# Patient Record
Sex: Female | Born: 1945 | Race: White | Hispanic: No | Marital: Married | Smoking: Former smoker
Health system: Southern US, Community
[De-identification: ages and names within clinical notes are randomized; demographics above are authoritative.]

## PROBLEM LIST (undated history)

## (undated) DIAGNOSIS — I10 Essential (primary) hypertension: Secondary | ICD-10-CM

## (undated) DIAGNOSIS — I8290 Acute embolism and thrombosis of unspecified vein: Secondary | ICD-10-CM

## (undated) DIAGNOSIS — M1990 Unspecified osteoarthritis, unspecified site: Secondary | ICD-10-CM

## (undated) HISTORY — PX: COLON SURGERY: SHX602

---

## 2009-08-25 ENCOUNTER — Ambulatory Visit: Payer: Self-pay | Admitting: Internal Medicine

## 2010-09-01 ENCOUNTER — Ambulatory Visit: Payer: Self-pay | Admitting: Internal Medicine

## 2011-09-14 ENCOUNTER — Ambulatory Visit: Payer: Self-pay | Admitting: Internal Medicine

## 2013-01-29 ENCOUNTER — Ambulatory Visit: Payer: Self-pay | Admitting: Internal Medicine

## 2014-05-22 ENCOUNTER — Ambulatory Visit: Payer: Self-pay | Admitting: Internal Medicine

## 2015-06-11 ENCOUNTER — Other Ambulatory Visit: Payer: Self-pay | Admitting: Internal Medicine

## 2015-06-11 DIAGNOSIS — Z1231 Encounter for screening mammogram for malignant neoplasm of breast: Secondary | ICD-10-CM

## 2015-06-19 ENCOUNTER — Ambulatory Visit
Admission: RE | Admit: 2015-06-19 | Discharge: 2015-06-19 | Disposition: A | Discharge status: Home / Self Care | Payer: Commercial Managed Care - HMO | Source: Ambulatory Visit | Attending: Internal Medicine | Admitting: Internal Medicine

## 2015-06-19 ENCOUNTER — Other Ambulatory Visit: Payer: Self-pay | Admitting: Internal Medicine

## 2015-06-19 DIAGNOSIS — Z1231 Encounter for screening mammogram for malignant neoplasm of breast: Secondary | ICD-10-CM

## 2016-08-16 ENCOUNTER — Other Ambulatory Visit: Payer: Self-pay | Admitting: Internal Medicine

## 2016-08-16 DIAGNOSIS — Z1231 Encounter for screening mammogram for malignant neoplasm of breast: Secondary | ICD-10-CM

## 2016-09-07 ENCOUNTER — Ambulatory Visit
Admission: RE | Admit: 2016-09-07 | Discharge: 2016-09-07 | Disposition: A | Discharge status: Home / Self Care | Payer: Medicare HMO | Source: Ambulatory Visit | Attending: Internal Medicine | Admitting: Internal Medicine

## 2016-09-07 ENCOUNTER — Encounter (HOSPITAL_COMMUNITY): Payer: Self-pay

## 2016-09-07 DIAGNOSIS — Z1231 Encounter for screening mammogram for malignant neoplasm of breast: Secondary | ICD-10-CM

## 2017-10-16 ENCOUNTER — Other Ambulatory Visit: Payer: Self-pay | Admitting: Internal Medicine

## 2017-10-16 DIAGNOSIS — Z1231 Encounter for screening mammogram for malignant neoplasm of breast: Secondary | ICD-10-CM

## 2017-11-08 ENCOUNTER — Ambulatory Visit
Admission: RE | Admit: 2017-11-08 | Discharge: 2017-11-08 | Disposition: A | Discharge status: Home / Self Care | Payer: Medicare HMO | Source: Ambulatory Visit | Attending: Internal Medicine | Admitting: Internal Medicine

## 2017-11-08 DIAGNOSIS — Z1231 Encounter for screening mammogram for malignant neoplasm of breast: Secondary | ICD-10-CM

## 2019-04-12 ENCOUNTER — Ambulatory Visit: Payer: Commercial Managed Care - HMO | Attending: Internal Medicine

## 2019-04-12 DIAGNOSIS — Z23 Encounter for immunization: Secondary | ICD-10-CM

## 2019-04-12 NOTE — Progress Notes (Signed)
   Covid-19 Vaccination Clinic  Name:  Latoya Mckinney    MRN: [id number] DOB: 08/12/1945  04/12/2019  [name] was observed post Covid-19 immunization for without incidence. She was provided with Vaccine Information Sheet and instruction to access the V-Safe system.   [name] was instructed to call 911 with any severe reactions post vaccine: Marland Kitchen Difficulty breathing  . Swelling of your face and throat  . A fast heartbeat  . A bad rash all over your body  . Dizziness and weakness    Immunizations Administered    Name Date Dose VIS Date Route   Pfizer COVID-19 Vaccine 04/12/2019 11:04 AM 0.3 mL 03/01/2019 Intramuscular   Manufacturer: [employer], [employer]   Lot: [id number]   NDC: [address]-2

## 2019-05-03 ENCOUNTER — Ambulatory Visit: Payer: Self-pay

## 2019-05-03 ENCOUNTER — Ambulatory Visit: Payer: Medicare HMO | Attending: Internal Medicine

## 2019-05-03 DIAGNOSIS — Z23 Encounter for immunization: Secondary | ICD-10-CM | POA: Insufficient documentation

## 2019-05-03 NOTE — Progress Notes (Signed)
   Covid-19 Vaccination Clinic  Name:  Latoya Mckinney    MRN: [id number] DOB: 17-Jun-1945  05/03/2019  Ms. Brookshire was observed post Covid-19 immunization for 15 minutes without incidence. She was provided with Vaccine Information Sheet and instruction to access the V-Safe system.   Ms. Pytel was instructed to call 911 with any severe reactions post vaccine: Marland Kitchen Difficulty breathing  . Swelling of your face and throat  . A fast heartbeat  . A bad rash all over your body  . Dizziness and weakness

## 2019-05-18 ENCOUNTER — Ambulatory Visit: Payer: Commercial Managed Care - HMO

## 2019-07-22 ENCOUNTER — Other Ambulatory Visit: Payer: Self-pay | Admitting: Internal Medicine

## 2019-07-22 DIAGNOSIS — Z1231 Encounter for screening mammogram for malignant neoplasm of breast: Secondary | ICD-10-CM

## 2019-07-24 ENCOUNTER — Ambulatory Visit
Admission: RE | Admit: 2019-07-24 | Discharge: 2019-07-24 | Disposition: A | Discharge status: Home / Self Care | Payer: Medicare HMO | Source: Ambulatory Visit | Attending: Internal Medicine | Admitting: Internal Medicine

## 2019-07-24 DIAGNOSIS — Z1231 Encounter for screening mammogram for malignant neoplasm of breast: Secondary | ICD-10-CM | POA: Diagnosis present

## 2020-08-14 LAB — COLOGUARD: COLOGUARD: POSITIVE — AB

## 2020-09-10 ENCOUNTER — Other Ambulatory Visit: Payer: Self-pay | Admitting: General Surgery

## 2020-09-10 NOTE — Progress Notes (Signed)
Subjective:     Patient ID: Latoya Mckinney is a 75 y.o. female.   HPI   The following portions of the patient's history were reviewed and updated as appropriate.   This an established patient is here today for: office visit. She is here to discuss having a colonoscopy due to a positive Cologuard referred by Dr Ramonita Lab. The patient has not had a previous colonoscopy. She did have a small bowel resection done in 2003 by Dr. Bary Castilla. Patient states she has bowel movements every 2-3 days. She denies any rectal bleeding but does state she has mucus off and on. Patient this is related to her diet, although she is not aware of any particularly offending foods.      Review of Systems  Constitutional: Negative for chills and fever.  Respiratory: Negative for cough.          Chief Complaint  Patient presents with   Follow-up      BP (!) 152/86   Pulse 68   Temp 36.5 C (97.7 F)   Ht 160 cm ('5\' 3"' )   Wt 62.6 kg (138 lb)   SpO2 98%   BMI 24.45 kg/m        Past Medical History:  Diagnosis Date   Hyperlipidemia     Hypertension     Leukopenia      Persistent. White blood cell count range 3.7 to 4.2. Iron, B-12, Folate levels, LDH normal, November 2006. Manual differential with question of smudge cells previously, not seen on follow up smears.   Osteoporosis, post-menopausal     Small bowel ischemia (CMS-HCC)       small bowel resection in February of 2003, under the direction of Dr. Bary Castilla.    Superior mesenteric vein thrombosis (CMS-HCC)      Patient on chronic Coumadin therapy.           Past Surgical History:  Procedure Laterality Date   RESECTION SMALL BOWEL       TUBAL LIGATION   1978                OB History     Gravida  3   Para  3   Term      Preterm      AB      Living         SAB      IAB      Ectopic      Molar      Multiple      Live Births           Obstetric Comments  Age at first period 1 Age of first pregnancy 63              Social History           Socioeconomic History   Marital status: Married  Tobacco Use   Smoking status: Former Smoker      Packs/day: 0.00      Years: 0.00      Pack years: 0.00      Types: Cigarettes      Quit date: 05/05/1983      Years since quitting: 37.3   Smokeless tobacco: Never Used   Tobacco comment: none since 1986  Vaping Use   Vaping Use: Never used  Substance and Sexual Activity   Alcohol use: Yes      Alcohol/week: 0.0 standard drinks      Comment: regular   Drug use:  Never   Sexual activity: Defer        No Known Allergies   Current Medications        Current Outpatient Medications  Medication Sig Dispense Refill   calcium carbonate 600 mg (1,500 mg) Tab tablet Take 600 mg by mouth once daily.       lisinopriL (ZESTRIL) 40 MG tablet TAKE 1 TABLET BY MOUTH DAILY 90 tablet 1   lovastatin (MEVACOR) 40 MG tablet TAKE 1 TABLET BY MOUTH EVERY DAY AT NIGHT 90 tablet 1   vitamin B complex (B COMPLETE ORAL) Take by mouth       warfarin (COUMADIN) 4 MG tablet TAKE 1 TABLET BY MOUTH ONCE DAILY. 90 tablet 1   warfarin (COUMADIN) 1 MG tablet TAKE 0.5 TABLETS (0.5 MG TOTAL) BY MOUTH ONCE DAILY (Patient not taking: Reported on 09/10/2020) 45 tablet 1    No current facility-administered medications for this visit.             Family History  Problem Relation Age of Onset   Coronary Artery Disease (Blocked arteries around heart) Father 17   Deep vein thrombosis (DVT or abnormal blood clot formation) Sister     Deep vein thrombosis (DVT or abnormal blood clot formation) Sister     Osteoporosis (Thinning of bones) Mother     Hip fracture Mother           Objective:   Physical Exam Exam conducted with a chaperone present.  Constitutional:      Appearance: Normal appearance.  Cardiovascular:     Rate and Rhythm: Normal rate and regular rhythm.     Pulses: Normal pulses.     Heart sounds: Normal heart sounds.  Pulmonary:     Effort: Pulmonary effort is  normal.     Breath sounds: Normal breath sounds.  Abdominal:     General: Abdomen is flat.     Palpations: Abdomen is soft.     Tenderness: There is no abdominal tenderness.    Musculoskeletal:     Cervical back: Neck supple.  Skin:    General: Skin is warm and dry.  Neurological:     Mental Status: She is alert and oriented to person, place, and time.  Psychiatric:        Mood and Affect: Mood normal.        Behavior: Behavior normal.      Labs and Radiology:    The patient provides majority of her history as records are presently not available.  In 2003 she had a mesenteric venous thrombosis and was seen at [facility].  She describes procedures suggestive of a transhepatic nature.  Several months later she presented with abdominal pain and subsequently had a small bowel resection.  Whether this was resulting ischemia from the prior mesenteric vein thrombosis or a catheter related problem was unclear.  Attempts are being made to obtain her records.   She has been asymptomatic since that time, maintained on oral anticoagulation without ill effect.   Serial pro time:   mponent Ref Range & Units 4 wk ago 2 mo ago 3 mo ago 4 mo ago 5 mo ago 6 mo ago 7 mo ago     Prothrombin Time 10.9 - 13.7 Sec 26.4 High   27.8 High   23.1 High   28.4 High   31.6 High  R  38.0 High  R  24.6 High  R    Prothrombin INR 2.0 - 3.0 2.4  2.5  2.1  2.6  2.9  3.4 High          Cologuard:   Ref Range & Units 1 mo ago  Test Result Negative Positive Abnormal      July 08, 2020 laboratory: Component Ref Range & Units 2 mo ago   Hemoglobin A1C 4.2 - 5.6 % 5.8 High    Average Blood Glucose (Calc) mg/dL 120     WBC (White Blood Cell Count) 4.1 - 10.2 10^3/uL 4.6   RBC (Red Blood Cell Count) 4.04 - 5.48 10^6/uL 4.65   Hemoglobin 12.0 - 15.0 gm/dL 14.0   Hematocrit 35.0 - 47.0 % 41.6   MCV (Mean Corpuscular Volume) 80.0 - 100.0 fl 89.5   MCH (Mean Corpuscular Hemoglobin) 27.0 - 31.2 pg 30.1   MCHC (Mean  Corpuscular Hemoglobin Concentration) 32.0 - 36.0 gm/dL 33.7   Platelet Count 150 - 450 10^3/uL 216   RDW-CV (Red Cell Distribution Width) 11.6 - 14.8 % 13.2   MPV (Mean Platelet Volume) 9.4 - 12.4 fl 9.2 Low    Neutrophils 1.50 - 7.80 10^3/uL 2.48   Lymphocytes 1.00 - 3.60 10^3/uL 1.46   Monocytes 0.00 - 1.50 10^3/uL 0.48   Eosinophils 0.00 - 0.55 10^3/uL 0.10   Basophils 0.00 - 0.09 10^3/uL 0.04   Neutrophil % 32.0 - 70.0 % 54.3   Lymphocyte % 10.0 - 50.0 % 31.9   Monocyte % 4.0 - 13.0 % 10.5   Eosinophil % 1.0 - 5.0 % 2.2   Basophil% 0.0 - 2.0 % 0.9   Immature Granulocyte % <=0.7 % 0.2     Glucose 70 - 110 mg/dL 105   Sodium 136 - 145 mmol/L 138   Potassium 3.6 - 5.1 mmol/L 4.8   Chloride 97 - 109 mmol/L 104   Carbon Dioxide (CO2) 22.0 - 32.0 mmol/L 28.4   Urea Nitrogen (BUN) 7 - 25 mg/dL 21   Creatinine 0.6 - 1.1 mg/dL 0.9   Glomerular Filtration Rate (eGFR), MDRD Estimate >60 mL/min/1.73sq m 61   Calcium 8.7 - 10.3 mg/dL 9.3   AST  8 - 39 U/L 19   ALT  5 - 38 U/L 16   Alk Phos (alkaline Phosphatase) 34 - 104 U/L 50   Albumin 3.5 - 4.8 g/dL 4.3   Bilirubin, Total 0.3 - 1.2 mg/dL 0.6   Protein, Total 6.1 - 7.9 g/dL 6.2   A/G Ratio 1.0 - 5.0 gm/dL 2.3            Assessment:     Positive Cologuard screening.   Long-term anticoagulation for suspected episode of mesenteric vein thrombosis.  (Records requested)    Plan:     Informal consultation with vascular surgery was undertaken to determine if preoperative imaging of the portal system would be of benefit.  Except for the probability/possibility of increased venous pressure if there is residual clot, there was no indication for an asymptomatic patient to undergo new imaging.   The risk associated with with holding her Coumadin for the procedure was reviewed.  As its been almost 2 decades since she was symptomatic, it seems reasonable to have her make use of aspirin, 81 mg beginning 1 week prior to the planned procedure,  3 days prior to the cessation of her anticoagulation to provide antiplatelet coverage for the procedure itself.   Risks of colonoscopy including bleeding and perforation were discussed.  In light of her positive Cologuard test, colonoscopy is indicated.   She was instructed in regards to preparation by the  staff.    This note is partially prepared by Karie Fetch, RN, acting as a scribe in the presence of Dr. Hervey Ard, MD.  The documentation recorded by the scribe accurately reflects the service I personally performed and the decisions made by me.    Robert Bellow, MD FACS

## 2020-09-30 ENCOUNTER — Encounter
Admission: RE | Disposition: A | Discharge status: Home / Self Care | Payer: Self-pay | Source: Home / Self Care | Attending: General Surgery

## 2020-09-30 ENCOUNTER — Other Ambulatory Visit: Payer: Self-pay

## 2020-09-30 ENCOUNTER — Ambulatory Visit: Payer: Medicare HMO | Admitting: Certified Registered Nurse Anesthetist

## 2020-09-30 ENCOUNTER — Encounter: Payer: Self-pay | Admitting: General Surgery

## 2020-09-30 ENCOUNTER — Ambulatory Visit
Admission: RE | Admit: 2020-09-30 | Discharge: 2020-09-30 | Disposition: A | Discharge status: Home / Self Care | Payer: Medicare HMO | Attending: General Surgery | Admitting: General Surgery

## 2020-09-30 DIAGNOSIS — Z7901 Long term (current) use of anticoagulants: Secondary | ICD-10-CM | POA: Diagnosis not present

## 2020-09-30 DIAGNOSIS — Z79899 Other long term (current) drug therapy: Secondary | ICD-10-CM | POA: Diagnosis not present

## 2020-09-30 DIAGNOSIS — K5730 Diverticulosis of large intestine without perforation or abscess without bleeding: Secondary | ICD-10-CM | POA: Insufficient documentation

## 2020-09-30 DIAGNOSIS — Z87891 Personal history of nicotine dependence: Secondary | ICD-10-CM | POA: Insufficient documentation

## 2020-09-30 DIAGNOSIS — R195 Other fecal abnormalities: Secondary | ICD-10-CM | POA: Insufficient documentation

## 2020-09-30 HISTORY — DX: Acute embolism and thrombosis of unspecified vein: I82.90

## 2020-09-30 HISTORY — PX: COLONOSCOPY WITH PROPOFOL: SHX5780

## 2020-09-30 HISTORY — DX: Essential (primary) hypertension: I10

## 2020-09-30 HISTORY — DX: Unspecified osteoarthritis, unspecified site: M19.90

## 2020-09-30 SURGERY — COLONOSCOPY WITH PROPOFOL
Anesthesia: General

## 2020-09-30 MED ORDER — PROPOFOL 500 MG/50ML IV EMUL
INTRAVENOUS | Status: DC | PRN
  Administered 2020-09-30: 140 ug/kg/min via INTRAVENOUS

## 2020-09-30 MED ORDER — LIDOCAINE HCL (CARDIAC) PF 100 MG/5ML IV SOSY
PREFILLED_SYRINGE | INTRAVENOUS | Status: DC | PRN
  Administered 2020-09-30: 100 mg via INTRAVENOUS

## 2020-09-30 MED ORDER — SODIUM CHLORIDE 0.9 % IV SOLN: INTRAVENOUS | Status: DC

## 2020-09-30 MED ORDER — PROPOFOL 10 MG/ML IV BOLUS
INTRAVENOUS | Status: DC | PRN
  Administered 2020-09-30: 10 mg via INTRAVENOUS
  Administered 2020-09-30: 60 mg via INTRAVENOUS

## 2020-09-30 NOTE — Transfer of Care (Signed)
Immediate Anesthesia Transfer of Care Note  Patient: Latoya Mckinney  Procedure(s) Performed: COLONOSCOPY WITH PROPOFOL  Patient Location: Endoscopy Unit  Anesthesia Type:General  Level of Consciousness: drowsy  Airway & Oxygen Therapy: Patient Spontanous Breathing  Post-op Assessment: Report given to RN and Post -op Vital signs reviewed and stable  Post vital signs: Reviewed and stable  Last Vitals:  Vitals Value Taken Time  BP 111/81 09/30/20 0837  Temp    Pulse 65 09/30/20 0837  Resp 11 09/30/20 0837  SpO2 96 % 09/30/20 0837  Vitals shown include unvalidated device data.  Last Pain:  Vitals:   09/30/20 0747  TempSrc: Temporal  PainSc: 0-No pain         Complications: No notable events documented.

## 2020-09-30 NOTE — Discharge Instructions (Signed)
Resume Coumadin today.    Resume Lovenox today, 60 mg twice a day injected.  Return to [facility] clinic lab Tuesday, July 19 for repeat pro time.  Dr. Graciela Husbands will contact you with results.  Resume your regular diet.

## 2020-09-30 NOTE — Anesthesia Preprocedure Evaluation (Signed)
Anesthesia Evaluation  Patient identified by MRN, date of birth, ID band Patient awake    Reviewed: Allergy & Precautions, NPO status , Patient's Chart, lab work & pertinent test results  Airway Mallampati: II  TM Distance: >3 FB Neck ROM: Full    Dental  (+) Teeth Intact   Pulmonary neg pulmonary ROS, former smoker,    Pulmonary exam normal        Cardiovascular hypertension, Pt. on medications negative cardio ROS Normal cardiovascular exam     Neuro/Psych negative neurological ROS  negative psych ROS   GI/Hepatic negative GI ROS, Neg liver ROS,   Endo/Other  negative endocrine ROS  Renal/GU negative Renal ROS  negative genitourinary   Musculoskeletal  (+) Arthritis ,   Abdominal   Peds negative pediatric ROS (+)  Hematology negative hematology ROS (+)   Anesthesia Other Findings . Hyperlipidemia  . Hypertension  . Leukopenia  Persistent. White blood cell count range 3.7 to 4.2. Iron, B-12, Folate levels, LDH normal, November 2006. Manual differential with question of smudge cells previously, not seen on follow up smears.  . Osteoporosis, post-menopausal  . Small bowel ischemia (CMS-HCC)  small bowel resection in February of 2003, under the direction of Dr. Lemar Livings.  . Superior mesenteric vein thrombosis (CMS-HCC)  Patient on chronic Coumadin therapy.     Reproductive/Obstetrics negative OB ROS                            Anesthesia Physical Anesthesia Plan  ASA: 3  Anesthesia Plan: General   Post-op Pain Management:    Induction: Intravenous  PONV Risk Score and Plan: 2 and TIVA and Propofol infusion  Airway Management Planned: Natural Airway and Nasal Cannula  Additional Equipment:   Intra-op Plan:   Post-operative Plan:   Informed Consent: I have reviewed the patients History and Physical, chart, labs and discussed the procedure including the risks, benefits and  alternatives for the proposed anesthesia with the patient or authorized representative who has indicated his/her understanding and acceptance.       Plan Discussed with: CRNA, Anesthesiologist and Surgeon  Anesthesia Plan Comments:         Anesthesia Quick Evaluation

## 2020-09-30 NOTE — Op Note (Signed)
[facility] Gastroenterology Patient Name: Latoya Mckinney Procedure Date: 09/30/2020 8:08 AM MRN: [id number] Account #: [id number] Date of Birth: [birth year] Admit Type: Outpatient Age: 75 Room: [facility] ENDO ROOM 1 Gender: Female Note Status: Finalized Procedure:             Colonoscopy Indications:           Positive Cologuard test Providers:             Earline Mayotte, MD Referring MD:          Daniel Nones, MD (Referring MD) Medicines:             Propofol per Anesthesia Complications:         No immediate complications. Procedure:             Pre-Anesthesia Assessment:                        - Prior to the procedure, a History and Physical was                         performed, and patient medications, allergies and                         sensitivities were reviewed. The patient's tolerance                         of previous anesthesia was reviewed.                        - The risks and benefits of the procedure and the                         sedation options and risks were discussed with the                         patient. All questions were answered and informed                         consent was obtained.                        After obtaining informed consent, the colonoscope was                         passed under direct vision. Throughout the procedure,                         the patient's blood pressure, pulse, and oxygen                         saturations were monitored continuously. The                         Colonoscope was introduced through the anus and                         advanced to the the cecum, identified by the                         appendiceal orifice, ileocecal valve and  palpation.                         The colonoscopy was performed without difficulty. The                         patient tolerated the procedure well. The quality of                         the bowel preparation was excellent. Findings:      A few  medium-mouthed diverticula were found in the sigmoid colon.      The retroflexed view of the distal rectum and anal verge was normal and       showed no anal or rectal abnormalities. Impression:            - Diverticulosis in the sigmoid colon.                        - The distal rectum and anal verge are normal on                         retroflexion view.                        - No specimens collected. Recommendation:        - Discharge patient to home (via wheelchair).                        - Resume regular diet indefinitely. Procedure Code(s):     --- Professional ---                        [phone], Colonoscopy, flexible; diagnostic, including                         collection of specimen(s) by brushing or washing, when                         performed (separate procedure) Diagnosis Code(s):     --- Professional ---                        K57.30, Diverticulosis of large intestine without                         perforation or abscess without bleeding                        R19.5, Other fecal abnormalities CPT copyright 2019 American Medical Association. All rights reserved. The codes documented in this report are preliminary and upon coder review may  be revised to meet current compliance requirements. Earline Mayotte, MD 09/30/2020 8:37:37 AM This report has been signed electronically. Number of Addenda: 0 Note Initiated On: 09/30/2020 8:08 AM Scope Withdrawal Time: 0 hours 10 minutes 50 seconds  Total Procedure Duration: 0 hours 16 minutes 56 seconds  Estimated Blood Loss:  Estimated blood loss: none.      [facility]

## 2020-09-30 NOTE — Anesthesia Postprocedure Evaluation (Signed)
Anesthesia Post Note  Patient: Latoya Mckinney  Procedure(s) Performed: COLONOSCOPY WITH PROPOFOL  Patient location during evaluation: Phase II Anesthesia Type: General Level of consciousness: awake and alert, awake and oriented Pain management: pain level controlled Vital Signs Assessment: post-procedure vital signs reviewed and stable Respiratory status: spontaneous breathing, nonlabored ventilation and respiratory function stable Cardiovascular status: blood pressure returned to baseline and stable Postop Assessment: no apparent nausea or vomiting Anesthetic complications: no   No notable events documented.   Last Vitals:  Vitals:   09/30/20 0848 09/30/20 0858  BP: (!) 161/85 (!) 158/88  Pulse: 70 64  Resp: 13 19  Temp:    SpO2: 98% 100%    Last Pain:  Vitals:   09/30/20 0858  TempSrc:   PainSc: 0-No pain                 Manfred Arch

## 2020-09-30 NOTE — H&P (Signed)
RYANNA TESCHNER [id number] [birth year]     HPI: 75 year old woman with positive Cologuard.  Past history of SMV thrombosis on chronic Coumadin.  Bridging therapy with Lovenox.  Last dose yesterday.  Nausea and vomiting when three quarters the way through the prep, was able to complete.  Medications Prior to Admission  Medication Sig Dispense Refill Last Dose   b complex vitamins capsule Take 1 capsule by mouth daily.   Past Week   enoxaparin (LOVENOX) 30 MG/0.3ML injection Inject 30 mg into the skin every 12 (twelve) hours.   09/29/2020 at 1800   lisinopril (ZESTRIL) 40 MG tablet Take 40 mg by mouth daily.   Past Week   lovastatin (MEVACOR) 40 MG tablet Take 40 mg by mouth at bedtime.   Past Week   warfarin (COUMADIN) 4 MG tablet Take 4 mg by mouth daily.   09/24/2020   calcium carbonate (OSCAL) 1500 (600 Ca) MG TABS tablet Take by mouth 2 (two) times daily with a meal. (Patient not taking: Reported on 09/30/2020)   Not Taking   No Known Allergies Past Medical History:  Diagnosis Date   Arthritis    Blood clot in vein    Mesenteric Vein   Hypertension    Past Surgical History:  Procedure Laterality Date   COLON SURGERY     Social History   Socioeconomic History   Marital status: Married    Spouse name: Not on file   Number of children: Not on file   Years of education: Not on file   Highest education level: Not on file  Occupational History   Not on file  Tobacco Use   Smoking status: Former    Pack years: 0.00    Types: Cigarettes    Quit date: 03/21/1984    Years since quitting: 36.5   Smokeless tobacco: Never  Vaping Use   Vaping Use: Never used  Substance and Sexual Activity   Alcohol use: Yes    Alcohol/week: 8.0 standard drinks    Types: 8 Cans of beer per week   Drug use: Never   Sexual activity: Not on file  Other Topics Concern   Not on file  Social History Narrative   Not on file   Social Determinants of Health   Financial Resource Strain: Not on file   Food Insecurity: Not on file  Transportation Needs: Not on file  Physical Activity: Not on file  Stress: Not on file  Social Connections: Not on file  Intimate Partner Violence: Not on file   Social History   Social History Narrative   Not on file     ROS: Negative.     PE: HEENT: Negative. Lungs: Clear. Cardio: RR.   Assessment/Plan:  Proceed with planned endoscopy.    Merrily Pew [facility] 09/30/2020

## 2020-09-30 NOTE — Anesthesia Procedure Notes (Signed)
Date/Time: 09/30/2020 8:18 AM Performed by: Joanette Gula, Keyon Winnick, CRNA Pre-anesthesia Checklist: Patient identified, Emergency Drugs available, Suction available, Patient being monitored and Timeout performed Patient Re-evaluated:Patient Re-evaluated prior to induction Oxygen Delivery Method: Nasal cannula Induction Type: IV induction

## 2020-10-01 ENCOUNTER — Other Ambulatory Visit: Payer: Self-pay | Admitting: Internal Medicine

## 2020-10-01 ENCOUNTER — Encounter: Payer: Self-pay | Admitting: General Surgery

## 2020-10-01 DIAGNOSIS — Z1231 Encounter for screening mammogram for malignant neoplasm of breast: Secondary | ICD-10-CM

## 2020-10-14 ENCOUNTER — Other Ambulatory Visit: Payer: Self-pay

## 2020-10-14 ENCOUNTER — Ambulatory Visit
Admission: RE | Admit: 2020-10-14 | Discharge: 2020-10-14 | Disposition: A | Discharge status: Home / Self Care | Payer: Medicare HMO | Source: Ambulatory Visit | Attending: Internal Medicine | Admitting: Internal Medicine

## 2020-10-14 DIAGNOSIS — Z1231 Encounter for screening mammogram for malignant neoplasm of breast: Secondary | ICD-10-CM | POA: Insufficient documentation

## 2021-09-30 ENCOUNTER — Other Ambulatory Visit: Payer: Self-pay | Admitting: Internal Medicine

## 2021-09-30 DIAGNOSIS — Z1231 Encounter for screening mammogram for malignant neoplasm of breast: Secondary | ICD-10-CM

## 2021-10-20 ENCOUNTER — Ambulatory Visit
Admission: RE | Admit: 2021-10-20 | Discharge: 2021-10-20 | Disposition: A | Discharge status: Home / Self Care | Payer: Medicare HMO | Source: Ambulatory Visit | Attending: Internal Medicine | Admitting: Internal Medicine

## 2021-10-20 DIAGNOSIS — Z1231 Encounter for screening mammogram for malignant neoplasm of breast: Secondary | ICD-10-CM | POA: Insufficient documentation

## 2022-10-21 ENCOUNTER — Other Ambulatory Visit: Payer: Self-pay | Admitting: Internal Medicine

## 2022-10-21 DIAGNOSIS — Z1231 Encounter for screening mammogram for malignant neoplasm of breast: Secondary | ICD-10-CM

## 2022-11-02 ENCOUNTER — Ambulatory Visit
Admission: RE | Admit: 2022-11-02 | Discharge: 2022-11-02 | Disposition: A | Discharge status: Home / Self Care | Payer: Medicare HMO | Source: Ambulatory Visit | Attending: Internal Medicine | Admitting: Internal Medicine

## 2022-11-02 DIAGNOSIS — Z1231 Encounter for screening mammogram for malignant neoplasm of breast: Secondary | ICD-10-CM | POA: Diagnosis not present

## 2023-01-29 IMAGING — MG MM DIGITAL SCREENING BILAT W/ TOMO AND CAD
8 series · 8 of 24 positions shown · non-contrast
Comparison: Previous exam(s).

CLINICAL DATA: Screening.

EXAM:
DIGITAL SCREENING BILATERAL MAMMOGRAM WITH TOMOSYNTHESIS AND CAD
TECHNIQUE: Bilateral screening digital craniocaudal and mediolateral oblique
mammograms were obtained. Bilateral screening digital breast
tomosynthesis was performed. The images were evaluated with
computer-aided detection.

[L MLO synth-2D]
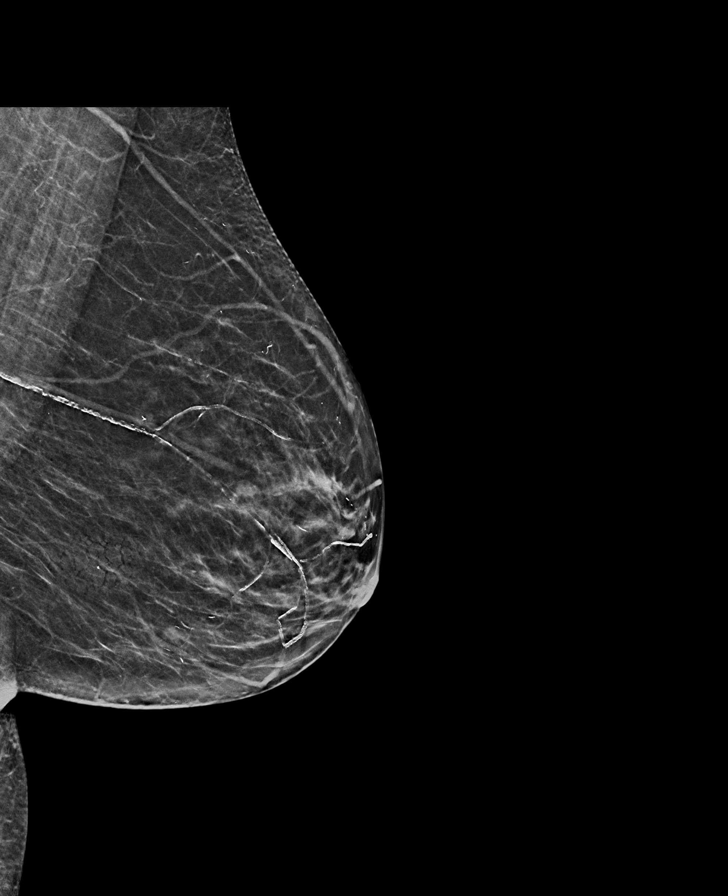

[R MLO synth-2D]
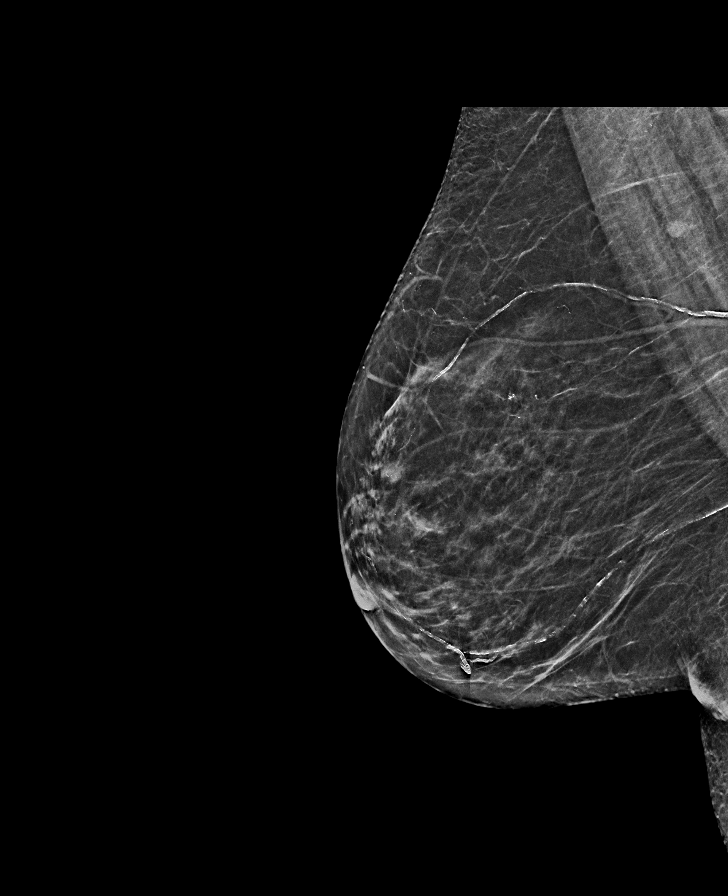

[L CC synth-2D]
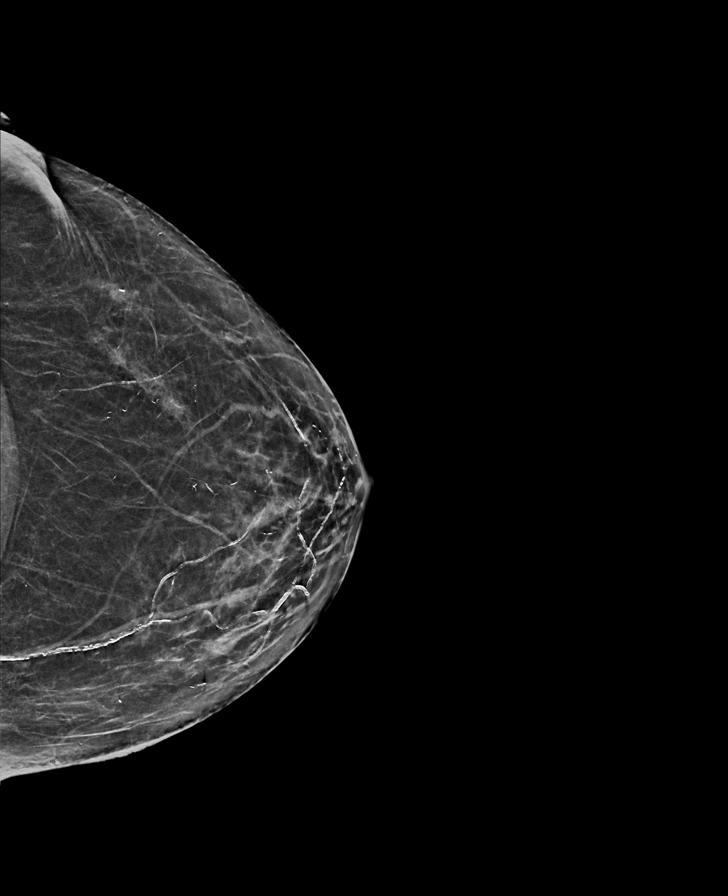

[R CC synth-2D]
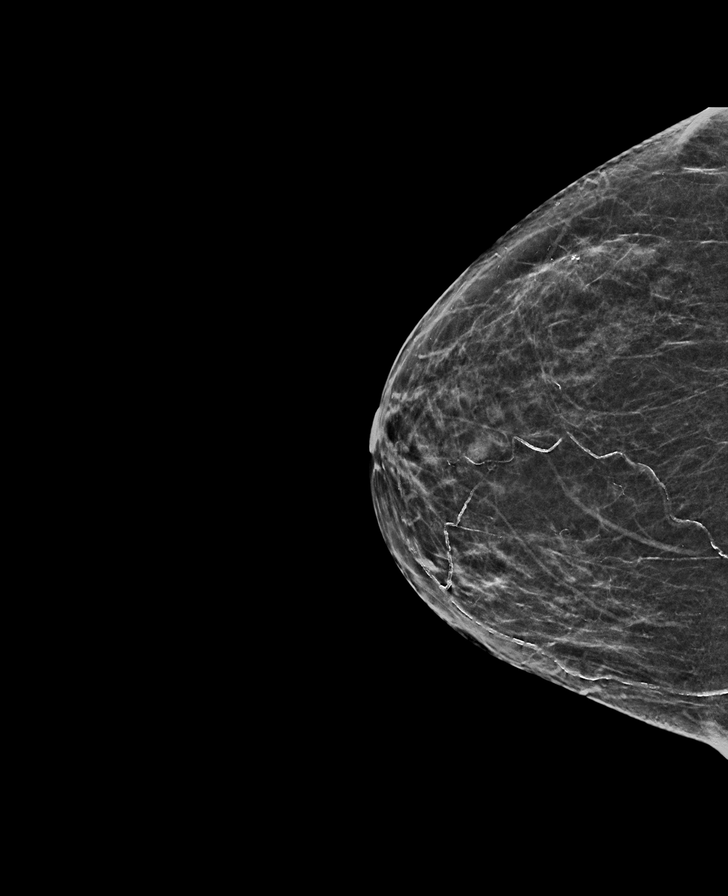

[R MLO tomo · tomo slice 29/57.0]
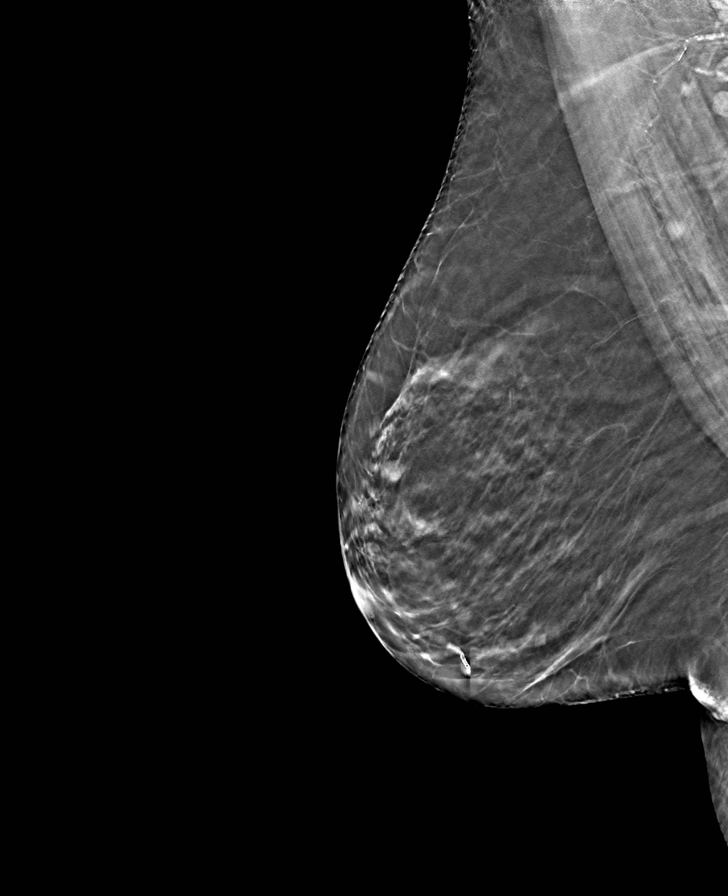

[L CC tomo · tomo slice 29/58.0]
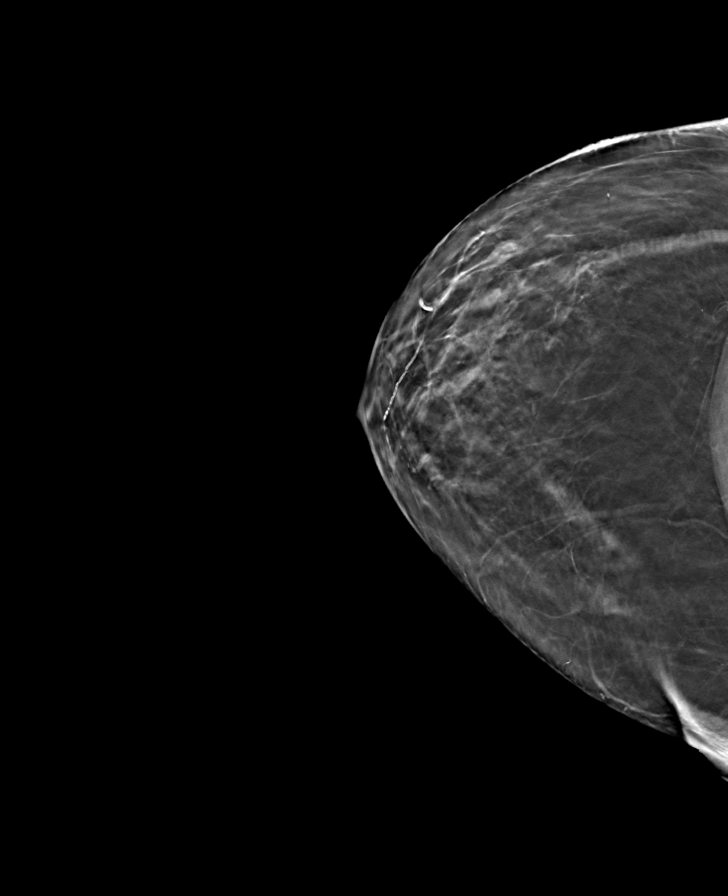

[L MLO tomo · tomo slice 27/54.0]
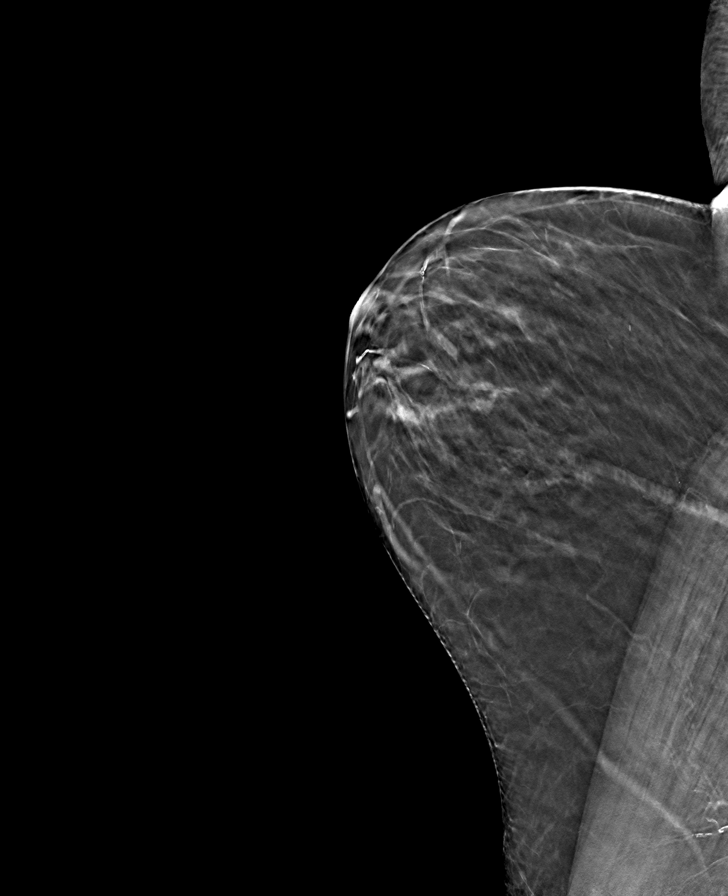

[R CC tomo · tomo slice 28/55.0]
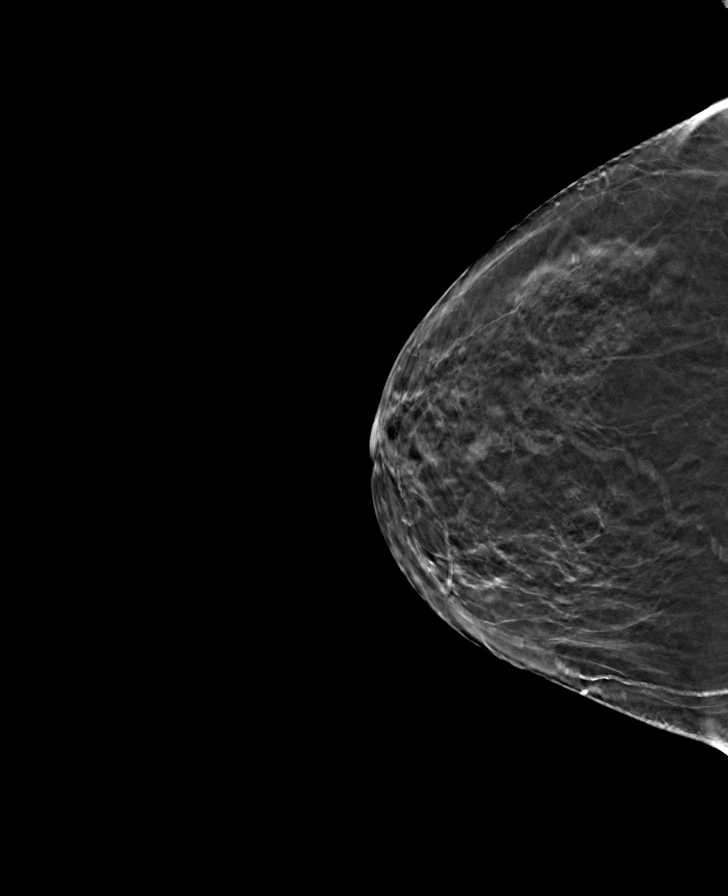

[8 of 24 positions shown; findings below may reference images not displayed]

ACR Breast Density Category c: The breast tissue is heterogeneously
dense, which may obscure small masses.
FINDINGS: There are no findings suspicious for malignancy.
IMPRESSION: No mammographic evidence of malignancy. A result letter of this
screening mammogram will be mailed directly to the patient.

RECOMMENDATION:
Screening mammogram in one year. (Code:Q3-W-BC3)

BI-RADS CATEGORY  1: Negative.

## 2023-04-12 DIAGNOSIS — Z7901 Long term (current) use of anticoagulants: Secondary | ICD-10-CM | POA: Diagnosis not present

## 2023-04-12 DIAGNOSIS — Z86718 Personal history of other venous thrombosis and embolism: Secondary | ICD-10-CM | POA: Diagnosis not present

## 2023-04-26 DIAGNOSIS — H2513 Age-related nuclear cataract, bilateral: Secondary | ICD-10-CM | POA: Diagnosis not present

## 2023-05-15 DIAGNOSIS — Z7901 Long term (current) use of anticoagulants: Secondary | ICD-10-CM | POA: Diagnosis not present

## 2023-05-15 DIAGNOSIS — Z86718 Personal history of other venous thrombosis and embolism: Secondary | ICD-10-CM | POA: Diagnosis not present

## 2023-06-14 DIAGNOSIS — Z86718 Personal history of other venous thrombosis and embolism: Secondary | ICD-10-CM | POA: Diagnosis not present

## 2023-06-14 DIAGNOSIS — Z7901 Long term (current) use of anticoagulants: Secondary | ICD-10-CM | POA: Diagnosis not present

## 2023-07-12 DIAGNOSIS — Z86718 Personal history of other venous thrombosis and embolism: Secondary | ICD-10-CM | POA: Diagnosis not present

## 2023-07-12 DIAGNOSIS — Z7901 Long term (current) use of anticoagulants: Secondary | ICD-10-CM | POA: Diagnosis not present

## 2023-08-02 DIAGNOSIS — Z86718 Personal history of other venous thrombosis and embolism: Secondary | ICD-10-CM | POA: Diagnosis not present

## 2023-08-02 DIAGNOSIS — Z7901 Long term (current) use of anticoagulants: Secondary | ICD-10-CM | POA: Diagnosis not present

## 2023-08-02 DIAGNOSIS — E7800 Pure hypercholesterolemia, unspecified: Secondary | ICD-10-CM | POA: Diagnosis not present

## 2023-08-02 DIAGNOSIS — I10 Essential (primary) hypertension: Secondary | ICD-10-CM | POA: Diagnosis not present

## 2023-08-02 DIAGNOSIS — D72818 Other decreased white blood cell count: Secondary | ICD-10-CM | POA: Diagnosis not present

## 2023-08-02 DIAGNOSIS — R7303 Prediabetes: Secondary | ICD-10-CM | POA: Diagnosis not present

## 2023-08-09 DIAGNOSIS — R7303 Prediabetes: Secondary | ICD-10-CM | POA: Diagnosis not present

## 2023-08-09 DIAGNOSIS — K559 Vascular disorder of intestine, unspecified: Secondary | ICD-10-CM | POA: Diagnosis not present

## 2023-08-09 DIAGNOSIS — Z86718 Personal history of other venous thrombosis and embolism: Secondary | ICD-10-CM | POA: Diagnosis not present

## 2023-08-09 DIAGNOSIS — D72818 Other decreased white blood cell count: Secondary | ICD-10-CM | POA: Diagnosis not present

## 2023-08-09 DIAGNOSIS — E7800 Pure hypercholesterolemia, unspecified: Secondary | ICD-10-CM | POA: Diagnosis not present

## 2023-08-09 DIAGNOSIS — I10 Essential (primary) hypertension: Secondary | ICD-10-CM | POA: Diagnosis not present

## 2023-09-06 DIAGNOSIS — Z7901 Long term (current) use of anticoagulants: Secondary | ICD-10-CM | POA: Diagnosis not present

## 2023-09-06 DIAGNOSIS — Z86718 Personal history of other venous thrombosis and embolism: Secondary | ICD-10-CM | POA: Diagnosis not present

## 2023-09-27 DIAGNOSIS — Z7901 Long term (current) use of anticoagulants: Secondary | ICD-10-CM | POA: Diagnosis not present

## 2023-09-27 DIAGNOSIS — Z86718 Personal history of other venous thrombosis and embolism: Secondary | ICD-10-CM | POA: Diagnosis not present

## 2023-10-24 ENCOUNTER — Other Ambulatory Visit: Payer: Self-pay | Admitting: Internal Medicine

## 2023-10-24 DIAGNOSIS — Z1231 Encounter for screening mammogram for malignant neoplasm of breast: Secondary | ICD-10-CM

## 2023-11-01 DIAGNOSIS — Z7901 Long term (current) use of anticoagulants: Secondary | ICD-10-CM | POA: Diagnosis not present

## 2023-11-01 DIAGNOSIS — Z86718 Personal history of other venous thrombosis and embolism: Secondary | ICD-10-CM | POA: Diagnosis not present

## 2023-11-15 ENCOUNTER — Ambulatory Visit
Admission: RE | Admit: 2023-11-15 | Discharge: 2023-11-15 | Disposition: A | Discharge status: Home / Self Care | Source: Ambulatory Visit | Attending: Internal Medicine | Admitting: Internal Medicine

## 2023-11-15 DIAGNOSIS — Z1231 Encounter for screening mammogram for malignant neoplasm of breast: Secondary | ICD-10-CM | POA: Insufficient documentation

## 2023-12-06 DIAGNOSIS — Z7901 Long term (current) use of anticoagulants: Secondary | ICD-10-CM | POA: Diagnosis not present

## 2023-12-06 DIAGNOSIS — Z86718 Personal history of other venous thrombosis and embolism: Secondary | ICD-10-CM | POA: Diagnosis not present

## 2024-01-10 DIAGNOSIS — Z7901 Long term (current) use of anticoagulants: Secondary | ICD-10-CM | POA: Diagnosis not present

## 2024-01-10 DIAGNOSIS — Z86718 Personal history of other venous thrombosis and embolism: Secondary | ICD-10-CM | POA: Diagnosis not present

## 2024-01-15 ENCOUNTER — Ambulatory Visit

## 2024-01-15 DIAGNOSIS — D492 Neoplasm of unspecified behavior of bone, soft tissue, and skin: Secondary | ICD-10-CM

## 2024-01-15 DIAGNOSIS — C4442 Squamous cell carcinoma of skin of scalp and neck: Secondary | ICD-10-CM | POA: Diagnosis not present

## 2024-01-15 DIAGNOSIS — L821 Other seborrheic keratosis: Secondary | ICD-10-CM | POA: Diagnosis not present

## 2024-01-15 DIAGNOSIS — L578 Other skin changes due to chronic exposure to nonionizing radiation: Secondary | ICD-10-CM | POA: Diagnosis not present

## 2024-01-15 DIAGNOSIS — W908XXA Exposure to other nonionizing radiation, initial encounter: Secondary | ICD-10-CM

## 2024-01-15 DIAGNOSIS — C4492 Squamous cell carcinoma of skin, unspecified: Secondary | ICD-10-CM

## 2024-01-15 DIAGNOSIS — L820 Inflamed seborrheic keratosis: Secondary | ICD-10-CM

## 2024-01-15 DIAGNOSIS — L814 Other melanin hyperpigmentation: Secondary | ICD-10-CM

## 2024-01-15 HISTORY — DX: Squamous cell carcinoma of skin, unspecified: C44.92

## 2024-01-15 NOTE — Progress Notes (Signed)
 Subjective   Latoya Mckinney is a 78 y.o. female who presents for the following: Lesion(s) of concern . Patient is new patient.  Today patient reports: LOC at scalp present x couple months, right shoulder, left flank, back. No personal or family hx of skin cancer.   Review of Systems:    No other skin or systemic complaints except as noted in HPI or Assessment and Plan.  The following portions of the chart were reviewed this encounter and updated as appropriate: medications, allergies, medical history  Relevant Medical History:  n/a   Objective  Well appearing patient in no apparent distress; mood and affect are within normal limits. Examination was performed of the: Sun Exposed Exam: Scalp, head, eyes, ears, nose, lips, neck, upper extremities, hands, fingers, fingernails  Examination notable for: Lentigo/lentigines: Scattered pigmented macules that are tan to brown in color and are somewhat non-uniform in shape and concentrated in the sun-exposed areas, Seborrheic Keratosis(es): Stuck-on appearing keratotic papule(s) on the trunk, some  irritated with redness, crusting, edema, and/or partial avulsion, Actinic Damage/Elastosis: chronic sun damage: dyspigmentation, telangiectasia, and wrinkling  Examination limited by: Undergarments, Shoes or socks , Clothing, and Patient deferred removal     Scalp 9.5 cm from frontal hairline with 1 cm hyperkeratotic pink plaque    Assessment & Plan   Benign Lesions/ Findings:  -Seborrheic Keratosis - Reassurance provided regarding the benign appearance of lesions noted on exam today; no treatment is indicated in the absence of symptoms/changes. - Reinforced importance of photoprotective strategies including liberal and frequent sunscreen use of a broad-spectrum SPF 30 or greater, use of protective clothing, and sun avoidance for prevention of cutaneous malignancy and photoaging.  Counseled patient on the importance of regular self-skin monitoring  as well as routine clinical skin examinations as scheduled.   ACTINIC DAMAGE - Chronic condition, secondary to cumulative UV/sun exposure - Recommend daily broad spectrum sunscreen SPF 30+ to sun-exposed areas, reapply every 2 hours as needed.  - Staying in the shade or wearing long sleeves, sun glasses (UVA+UVB protection) and wide brim hats (4-inch brim around the entire circumference of the hat) are also recommended for sun protection.  - Call for new or changing lesions.  Level of service outlined above   Procedures, orders, diagnosis for this visit:  NEOPLASM OF SKIN Scalp Skin / nail biopsy Type of biopsy: tangential   Informed consent: discussed and consent obtained   Timeout: patient name, date of birth, surgical site, and procedure verified   Procedure prep:  Patient was prepped and draped in usual sterile fashion Prep type:  Isopropyl alcohol Anesthesia: the lesion was anesthetized in a standard fashion   Anesthetic:  1% lidocaine  w/ epinephrine 1-100,000 buffered w/ 8.4% NaHCO3 Instrument used: DermaBlade   Hemostasis achieved with: pressure and aluminum chloride   Outcome: patient tolerated procedure well   Post-procedure details: sterile dressing applied and wound care instructions given   Dressing type: bandage and petrolatum    Specimen 1 - Surgical pathology Differential Diagnosis: SCC vs ISK vs Hypertrophic AK  Check Margins: No 9.5 cm from frontal hairline with 1 cm hyperkeratotic pink plaque Discussed if biopsy proves skin cancer would refer for Mohs surgery.  LENTIGO   SEBORRHEIC KERATOSIS   INFLAMED SEBORRHEIC KERATOSIS   ACTINIC SKIN DAMAGE   SEBORRHEIC KERATOSIS, INFLAMED Right Shoulder - Anterior Destruction of lesion - Right Shoulder - Anterior Complexity: simple   Destruction method: cryotherapy   Informed consent: discussed and consent obtained   Timeout:  patient name, date of birth, surgical site, and procedure verified Outcome: patient  tolerated procedure well with no complications   Post-procedure details: wound care instructions given     Neoplasm of skin -     Skin / nail biopsy -     Surgical pathology; Standing  Lentigo  Seborrheic keratosis  Inflamed seborrheic keratosis  Actinic skin damage  Seborrheic keratosis, inflamed -     Destruction of lesion    Return to clinic: Return if symptoms worsen or fail to improve, for pending biopsy results, w/ Dr. Raymund.  I, Jacquelynn V. Wilfred, CMA, am acting as scribe for Lauraine JAYSON Raymund, MD .  Documentation: I have reviewed the above documentation for accuracy and completeness, and I agree with the above.  Lauraine JAYSON Raymund, MD

## 2024-01-15 NOTE — Patient Instructions (Addendum)

## 2024-01-19 LAB — SURGICAL PATHOLOGY

## 2024-01-22 ENCOUNTER — Ambulatory Visit: Payer: Self-pay

## 2024-01-22 DIAGNOSIS — C4442 Squamous cell carcinoma of skin of scalp and neck: Secondary | ICD-10-CM

## 2024-01-22 NOTE — Telephone Encounter (Signed)
-----   Message from Lauraine JAYSON Kanaris sent at 01/22/2024 10:42 AM EST -----    1. Skin, scalp :       POORLY DIFFERENTIATED SQUAMOUS CELL CARCINOMA, INFILTRATIVE PATTERN   Please notify patient with below plan: Mohs  ----- Message ----- From: Interface, [facility] Sent: 01/19/2024   4:57 PM EST To: Lauraine JAYSON Kanaris, MD

## 2024-01-22 NOTE — Telephone Encounter (Signed)
 Discussed pathology results and treatment plan. Patient voiced understanding. Referral sent to Dr. Fain Home.

## 2024-02-06 ENCOUNTER — Ambulatory Visit: Admitting: Dermatology

## 2024-02-06 ENCOUNTER — Encounter: Payer: Self-pay | Admitting: Dermatology

## 2024-02-06 VITALS — BP 168/80 | HR 73 | Temp 98.2°F

## 2024-02-06 DIAGNOSIS — L814 Other melanin hyperpigmentation: Secondary | ICD-10-CM | POA: Diagnosis not present

## 2024-02-06 DIAGNOSIS — C4442 Squamous cell carcinoma of skin of scalp and neck: Secondary | ICD-10-CM

## 2024-02-06 DIAGNOSIS — C4441 Basal cell carcinoma of skin of scalp and neck: Secondary | ICD-10-CM | POA: Diagnosis not present

## 2024-02-06 DIAGNOSIS — L578 Other skin changes due to chronic exposure to nonionizing radiation: Secondary | ICD-10-CM | POA: Diagnosis not present

## 2024-02-06 DIAGNOSIS — C4492 Squamous cell carcinoma of skin, unspecified: Secondary | ICD-10-CM

## 2024-02-06 NOTE — Patient Instructions (Signed)

## 2024-02-06 NOTE — Progress Notes (Signed)
 Follow-Up Visit   Subjective  Latoya Mckinney is a 78 y.o. 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 Raymund. Lesion has been present for several months and growing quickly.   The following portions of the chart were reviewed this encounter and updated as appropriate: medications, allergies, medical history  Review of Systems:  No other skin or systemic complaints except as noted in HPI or Assessment and Plan.  Objective  Well appearing patient in no apparent distress; mood and affect are within normal limits.  A focused examination was performed of the following areas: scalp Relevant physical exam findings are noted in the Assessment and Plan.   Mid Parietal Scalp Large hyperkeratotic plaque   Assessment & Plan   SQUAMOUS CELL CARCINOMA OF SKIN Mid Parietal Scalp Mohs surgery  Consent obtained: written  Anticoagulation: Is the patient taking prescription anticoagulant and/or aspirin prescribed/recommended by a physician? Yes   Was the anticoagulation regimen changed prior to Mohs? No    Anesthesia: Anesthesia method: local infiltration Local anesthetic: lidocaine  1% WITH epi  Procedure Details: Timeout: pre-procedure verification complete Procedure Prep: patient was prepped and draped in usual sterile fashion Prep type: chlorhexidine Biopsy accession number: [id number] Pre-Op diagnosis: squamous cell carcinoma SCC subtype: poorly differentiated BWH stage: T1 Surgery side: midline Surgical site (from skin exam): Mid Parietal Scalp Pre-operative length (cm): 1.7 Pre-operative width (cm): 1.3 Indications for Mohs surgery: anatomic location where tissue conservation is critical and aggressive histology  Micrographic Surgery Details: Post-operative length (cm): 2.8 Post-operative width (cm): 2.5 Number of Mohs stages: 1 Post surgery depth of defect: subcutaneous fat  Stage 1    Tumor  features identified on Mohs section: no tumor identified  Reconstruction: Was the defect reconstructed? Yes   Was reconstruction performed by the same Mohs surgeon? Yes   Setting of reconstruction: outpatient office When was reconstruction performed? same day Type of reconstruction: linear Linear reconstruction: complex  Skin repair Complexity:  Complex Final length (cm):  5.5 Informed consent: discussed and consent obtained   Timeout: patient name, date of birth, surgical site, and procedure verified   Procedure prep:  Patient was prepped and draped in usual sterile fashion Prep type:  Chlorhexidine Anesthesia: the lesion was anesthetized in a standard fashion   Anesthetic:  1% lidocaine  w/ epinephrine 1-100,000 buffered w/ 8.4% NaHCO3 Reason for type of repair: reduce tension to allow closure, reduce subcutaneous dead space and avoid a hematoma, allow closure of the large defect, preserve normal anatomy, allow side-to-side closure without requiring a flap or graft and compensate for the inelasticity of skin in this area   Undermining: area extensively undermined   Subcutaneous layers (deep stitches):  Suture size:  3-0 Suture type: Vicryl (polyglactin 910)   Stitches:  Buried vertical mattress Fine/surface layer approximation (top stitches):  Suture size:  3-0 (with staples) Suture type: Prolene (polypropylene)   Suture type comment:  Staples Stitches: vertical mattress   Hemostasis achieved with: suture, pressure and electrodesiccation Outcome: patient tolerated procedure well with no complications   Post-procedure details: sterile dressing applied and wound care instructions given   Dressing type: bandage and pressure dressing    Skin excision Specimen 1 - Surgical pathology Differential Diagnosis: Poorly diff SCC but debulk looked like basosquamous Check PNI  Check Margins: No   Return in about 13 days (around 02/19/2024) for wound check/staple removal, wound  check.  I, Latoya Mckinney, CMA, am acting as scribe for  Latoya CHRISTELLA HOLY, MD.    02/06/2024  HISTORY OF PRESENT ILLNESS  Latoya Mckinney is seen in consultation at the request of Dr. Raymund for biopsy-proven Poorly Differentiated Squamous Cell Carcinoma on the scalp. They note that the area has been present for about 2 months increasing in size with time.  There is no history of previous treatment.  Reports no other new or changing lesions and has no other complaints today.  Medications and allergies: see patient chart.  Review of systems: Reviewed 8 systems and notable for the above skin cancer.  All other systems reviewed are unremarkable/negative, unless noted in the HPI. Past medical history, surgical history, family history, social history were also reviewed and are noted in the chart/questionnaire.    PHYSICAL EXAMINATION  General: Well-appearing, in no acute distress, alert and oriented x 4. Vitals reviewed in chart (if available).   Skin: Exam reveals a 1.7 x 1.3 cm erythematous papule and biopsy scar on the scalp. There are rhytids, telangiectasias, and lentigines, consistent with photodamage.   Biopsy report(s) reviewed, confirming the diagnosis.   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 Due to location, size, histology, or recurrence and the likelihood of subclinical extension as well as the need to conserve normal surrounding tissue, the patient was deemed acceptable for Mohs micrographic surgery (MMS).  The nature and purpose of the procedure, associated benefits and risks including recurrence and scarring, possible complications such as pain, infection, and bleeding, and alternative methods of treatment if appropriate were discussed with the patient during consent. The lesion location was verified by the patient, by reviewing previous notes, pathology reports, and by photographs as well as  angulation measurements if available.  Informed consent was reviewed and signed by the patient, and timeout was performed at 9:00 AM. See op note below.  2. For the photodamage and solar lentigines, sun protection discussed/information given on OTC sunscreens, and we recommend continued regular follow-up with primary dermatologist every 6 months or sooner for any growing, bleeding, or changing lesions. 3. Prognosis and future surveillance discussed. 4. Letter with treatment outcome sent to referring provider. 5. Pain acetaminophen/ibuprofen  MOHS MICROGRAPHIC SURGERY AND RECONSTRUCTION  Initial size:   1.7 x 1.3 cm Surgical defect/wound size: 2.8 x 2.5 cm Anesthesia:    0.33% lidocaine  with 1:200,000 epinephrine EBL:    <5 mL Complications:  None Repair type:   Complex SQ suture:   3-0 Vicryl Cutaneous suture:  3-0 Polyprolene and Staples Final size of the repair: 5.5 cm  Stages: 1  STAGE I: Anesthesia achieved with 0.5% lidocaine  with 1:200,000 epinephrine. ChloraPrep applied. 2 section(s) excised using Mohs technique (this includes total peripheral and deep tissue margin excision and evaluation with frozen sections, excised and interpreted by the same physician). The tumor was first debulked and then excised with an approx. 2mm margin.  Hemostasis was achieved with electrocautery as needed.  The specimen was then oriented, subdivided/relaxed, inked, and processed using Mohs technique.    Frozen section analysis revealed a clear deep and peripheral margin.  Reconstruction  The surgical wound was then cleaned, prepped, and re-anesthetized as above. Wound edges were undermined extensively along at least one entire edge and at a distance equal to or greater than the width of the defect (see wound defect size above) in order to achieve closure and decrease wound tension and anatomic distortion. Redundant tissue repair including standing cone removal was performed.  Hemostasis was achieved with  electrocautery. Subcutaneous and epidermal tissues were approximated with the above sutures. The surgical site was then lightly scrubbed with sterile, saline-soaked gauze. Steri-strips were applied, and the area was then bandaged using Vaseline ointment, non-adherent gauze, gauze pads, and tape to provide an adequate pressure dressing. The patient tolerated the procedure well, was given detailed written and verbal wound care instructions, and was discharged in good condition.   The patient will follow-up: 10 days.  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 Remainder of debulk specimen sent for permanent sections to rule out PNI and confirm inconsistency in vertical sectioning vs initial biopsy specimen.  Documentation: I have reviewed the above documentation for accuracy and completeness, and I agree with the above.  Latoya CHRISTELLA HOLY, MD

## 2024-02-09 DIAGNOSIS — D72818 Other decreased white blood cell count: Secondary | ICD-10-CM | POA: Diagnosis not present

## 2024-02-09 DIAGNOSIS — E7800 Pure hypercholesterolemia, unspecified: Secondary | ICD-10-CM | POA: Diagnosis not present

## 2024-02-09 DIAGNOSIS — Z86718 Personal history of other venous thrombosis and embolism: Secondary | ICD-10-CM | POA: Diagnosis not present

## 2024-02-09 DIAGNOSIS — Z7901 Long term (current) use of anticoagulants: Secondary | ICD-10-CM | POA: Diagnosis not present

## 2024-02-09 DIAGNOSIS — R7303 Prediabetes: Secondary | ICD-10-CM | POA: Diagnosis not present

## 2024-02-19 ENCOUNTER — Encounter: Payer: Self-pay | Admitting: Dermatology

## 2024-02-19 ENCOUNTER — Ambulatory Visit: Admitting: Dermatology

## 2024-02-19 DIAGNOSIS — L539 Erythematous condition, unspecified: Secondary | ICD-10-CM

## 2024-02-19 DIAGNOSIS — Z85828 Personal history of other malignant neoplasm of skin: Secondary | ICD-10-CM | POA: Diagnosis not present

## 2024-02-19 DIAGNOSIS — L905 Scar conditions and fibrosis of skin: Secondary | ICD-10-CM | POA: Diagnosis not present

## 2024-02-19 DIAGNOSIS — C4499 Other specified malignant neoplasm of skin, unspecified: Secondary | ICD-10-CM

## 2024-02-19 DIAGNOSIS — T1490XD Injury, unspecified, subsequent encounter: Secondary | ICD-10-CM

## 2024-02-19 NOTE — Patient Instructions (Signed)

## 2024-02-19 NOTE — Progress Notes (Signed)
   Follow Up Visit   Subjective  Latoya Mckinney is a 78 y.o. 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 The patient has been bandaging the wound as directed. The endorse the following concerns: none  The following portions of the chart were reviewed this encounter and updated as appropriate: medications, allergies, medical history  Review of Systems:  No other skin or systemic complaints except as noted in HPI or Assessment and Plan.  Objective  Well appearing patient in no apparent distress; mood and affect are within normal limits.  A focal examination was performed including scalp, head, face and mid parietal scalp. All findings within normal limits unless otherwise noted below.  Healing wound with mild erythema  Relevant physical exam findings are noted in the Assessment and Plan.       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 and permanents, treated on 02/06/24, repaired with linear closure - Reassured that wound is healing well - No evidence of infection - No swelling, induration, purulence, dehiscence, or tenderness out of proportion to the clinical exam, see photo above - Discussed that scars take up to 12 months to mature from the date of surgery - Recommend SPF 30+ to scar daily to prevent purple color from UV exposure during scar maturation process - Discussed that erythema and raised appearance of scar will fade over the next 4-6 months - OK to start scar massage at 4-6 weeks post-op - Can consider silicone based products for scar healing starting at 6 weeks post-op - Ok to continue ointment daily to wound under a bandage for another week  HISTORY OF BASOSQUAMOUS/BASAL CELL CARCINOMA - No evidence of recurrence  today - Recommend regular full body skin exams - Recommend daily broad spectrum sunscreen SPF 30+ to sun-exposed areas, reapply every 2 hours as needed.  - Call if any new or changing lesions are noted between office visits - Discussed that immunostains from [facility] revealed Basaloid lesion, BCC/Basosquamous, consistent with the debulk seen under Mohs sections. There were no features of SCC. Therefore we do not need to proceed with imaging or BWH staging.   Return in 6 weeks (on 04/01/2024) for wound check.  I, Darice Smock, CMA, am acting as scribe for RUFUS CHRISTELLA HOLY, MD.   Documentation: I have reviewed the above documentation for accuracy and completeness, and I agree with the above.  RUFUS CHRISTELLA HOLY, MD

## 2024-02-21 DIAGNOSIS — D72818 Other decreased white blood cell count: Secondary | ICD-10-CM | POA: Diagnosis not present

## 2024-02-21 DIAGNOSIS — Z86718 Personal history of other venous thrombosis and embolism: Secondary | ICD-10-CM | POA: Diagnosis not present

## 2024-02-21 DIAGNOSIS — M810 Age-related osteoporosis without current pathological fracture: Secondary | ICD-10-CM | POA: Diagnosis not present

## 2024-02-21 DIAGNOSIS — K559 Vascular disorder of intestine, unspecified: Secondary | ICD-10-CM | POA: Diagnosis not present

## 2024-02-21 DIAGNOSIS — I10 Essential (primary) hypertension: Secondary | ICD-10-CM | POA: Diagnosis not present

## 2024-02-21 DIAGNOSIS — R7303 Prediabetes: Secondary | ICD-10-CM | POA: Diagnosis not present

## 2024-02-21 DIAGNOSIS — Z1331 Encounter for screening for depression: Secondary | ICD-10-CM | POA: Diagnosis not present

## 2024-02-21 DIAGNOSIS — E7800 Pure hypercholesterolemia, unspecified: Secondary | ICD-10-CM | POA: Diagnosis not present

## 2024-02-21 DIAGNOSIS — Z0000 Encounter for general adult medical examination without abnormal findings: Secondary | ICD-10-CM | POA: Diagnosis not present

## 2024-02-21 DIAGNOSIS — E038 Other specified hypothyroidism: Secondary | ICD-10-CM | POA: Diagnosis not present

## 2024-02-22 ENCOUNTER — Ambulatory Visit

## 2024-02-22 DIAGNOSIS — Z1283 Encounter for screening for malignant neoplasm of skin: Secondary | ICD-10-CM

## 2024-02-22 DIAGNOSIS — C4490 Unspecified malignant neoplasm of skin, unspecified: Secondary | ICD-10-CM

## 2024-02-22 DIAGNOSIS — L820 Inflamed seborrheic keratosis: Secondary | ICD-10-CM | POA: Diagnosis not present

## 2024-02-22 DIAGNOSIS — L570 Actinic keratosis: Secondary | ICD-10-CM

## 2024-02-22 DIAGNOSIS — D229 Melanocytic nevi, unspecified: Secondary | ICD-10-CM

## 2024-02-22 DIAGNOSIS — L821 Other seborrheic keratosis: Secondary | ICD-10-CM

## 2024-02-22 DIAGNOSIS — W908XXA Exposure to other nonionizing radiation, initial encounter: Secondary | ICD-10-CM

## 2024-02-22 DIAGNOSIS — Z85828 Personal history of other malignant neoplasm of skin: Secondary | ICD-10-CM

## 2024-02-22 DIAGNOSIS — L814 Other melanin hyperpigmentation: Secondary | ICD-10-CM | POA: Diagnosis not present

## 2024-02-22 DIAGNOSIS — D1801 Hemangioma of skin and subcutaneous tissue: Secondary | ICD-10-CM | POA: Diagnosis not present

## 2024-02-22 DIAGNOSIS — L578 Other skin changes due to chronic exposure to nonionizing radiation: Secondary | ICD-10-CM | POA: Diagnosis not present

## 2024-02-22 NOTE — Patient Instructions (Addendum)
 Sunscreen  Who needs sunscreen? Everyone. Sunscreen use can help prevent skin cancer by protecting you from the sun's harmful ultraviolet rays. Anyone can get skin cancer, regardless of age, gender or race. In fact, it is estimated that one in five Americans will develop skin cancer in their lifetime.  Sunscreen alone cannot fully protect you. In addition to wearing sunscreen, dermatologists recommend taking the following steps to protect your skin and find skin cancer early:  Seek shade when appropriate, remembering that the sun's rays are strongest between 10 a.m. and 2 p.m. If your shadow is shorter than you are, seek shade. Dress to protect yourself from the sun by wearing a lightweight long-sleeved shirt, pants, a wide-brimmed hat and sunglasses, when possible.  Use extra caution near water, snow and sand as they reflect the damaging rays of the sun, which can increase your chance of sunburn.  Get vitamin D safely through a healthy diet that may include vitamin supplements. Don't seek the sun. Avoid tanning beds. Ultraviolet light from the sun and tanning beds can cause skin cancer and wrinkling. If you want to look tan, you may wish to use a self-tanning product, but continue to use sunscreen with it.  When should I use sunscreen? Every day you go outside--even if you're just walking to and from your form of transportation. The sun emits harmful UV rays year-round. Even on cloudy days, up to 80 percent of the sun's harmful UV rays can penetrate your skin. Snow, sand and water increase the need for sunscreen because they reflect the sun's rays.  How much sunscreen should I use, and how often should I apply it? Most people only apply 25-50 percent of the recommended amount of sunscreen. Apply enough sunscreen to cover all exposed skin. Most adults need about 1 ounce -- or enough to fill a shot glass -- to fully cover their body.  Don't forget to apply to the tops of your feet, your neck, your  ears and the top of your head. Apply sunscreen to dry skin 15 minutes before going outdoors.  Skin cancer also can form on the lips. To protect your lips, apply a lip balm or lipstick that contains sunscreen with an SPF of 30 or higher.  When outdoors, reapply sunscreen approximately every two hours, or after swimming or sweating, according to the directions on the bottle.   Broad-spectrum sunscreens protect against both UVA and UVB rays. What is the difference between the rays? Sunlight consists of two types of harmful rays that reach the earth -- UVA rays and UVB rays. Overexposure to either can lead to skin cancer. In addition to causing skin cancer, here's what each of these rays do:  UVA rays (or aging rays) can prematurely age your skin, causing wrinkles and age spots, and can pass through window glass. UVB rays (or burning rays) are the primary cause of sunburn and are blocked by window glass  There is no safe way to tan. Every time you tan, you damage your skin. As this damage builds, you speed up the aging of your skin and increase your risk for all types of skin cancer.  What is the difference between chemical and physical sunscreens? Chemical sunscreens work like a sponge, absorbing the sun's rays. They contain one or more of the following active ingredients: oxybenzone, avobenzone, octisalate, octocrylene, homosalate and octinoxate. These formulations tend to be easier to rub into the skin without leaving a white residue.   Physical sunscreens work like a shield,  sitting sit on the surface of your skin and deflecting the sun's rays. They contain the active ingredients zinc oxide and/or titanium dioxide. Use this sunscreen if you have sensitive skin.   What type of sunscreen should I use? The best type of sunscreen is the one you will use again and again. Just make sure it offers broad-spectrum (UVA and UVB) protection, has an SPF of 30+, and is water-resistant. The kind of sunscreen you  use is a matter of personal choice, and may vary depending on the area of the body to be protected. Available sunscreen options include lotions, creams, gels, ointments, wax sticks and sprays.  Recommended physical sunscreens for face: - Neutrogena Sheer Zinc - Aveeno Positively Mineral Sensitive - CeraVe Hydrating Mineral (also has a tinted version) - La Roche-Posay Anthelios Mineral Face (comes as a cream, lotion, light fluid, and there is also a tinted version).  - EltaMD UV Clear (also has a tinted version)  Recommended physical sunscreens for body: - Neutrogena Sheer Zinc Dry-Touch Sunscreen Sensitive Skin Lotion Broad Spectrum SPF 50 - Aveeno Positively Mineral Sensitive Skin Sunscreen Broad Spectrum SPF 50 - La Roche-Posay Anthelios SPF 50 Mineral Sunscreen - Gentle Lotion - CeraVe Hydrating Mineral Sunscreen SPF 50  Recommended chemical sunscreens for face: - Anthelios UV Correct Face Sunscreen SPF 70 with Niacinamide - Neutrogena Clear Face Oil-Free SPF 50 with Helioplex - Neutrogena Sport Face Oil-Free SPF 70+ with Helioplex - Aveeno Protect + Hydrate Sunscreen For Face SPF 70 - La Roche-Posay Anthelios Light Fluid Sunscreen for Face SPF 60  Recommended chemical sunscreens for body: - Neutrogena Ultra Sheer Dry-Touch Sunscreen SPF 70 - Aveeno Protect + Hydrate Broad Spectrum All-Day Hydration SPF 60 (comes in a big pump) - La Roche-Posay Anthelios Melt-In Milk Sunscreen SPF 60   Cryosurgery  Cryosurgery ("freezing") uses liquid nitrogen to destroy certain types of skin lesions. Lowering the temperature of the lesion in a small area surrounding skin destroys the lesion. Immediately following cryosurgery, you will notice redness and swelling of the treatment area. Blistering or weeping may occur, lasting approximately one week which will then be followed by crusting. Most areas will heal completely in 10 to 14 days.  Wash the treated areas daily. Allow soap and water to run  over the areas, but do not scrub. Should a scab or crust form, allow it to fall off on its own. Do not remove or pick at it. Application of an ointment  and a bandage may make you feel more comfortable, but it is not necessary. Some people develop an allergy to Neosporin, so we recommend that Vaseline or  Aquaphor be used.  The cryotherapy site will be more sensitive than your surrounding skin. Keep it covered, and remember to apply sunscreen every day to all your sun exposed skin. A scar may remain which is lighter or pinker than your normal skin. Your body will continue to improve your scar for up to one year; however a light-colored scar may remain.  Infection following cryotherapy is rare. However if you are worried about the appearance of the treated area, contact your doctor. We have a physician on call at all times. If you have any concerns about the site, please call our clinic at [phone]  Due to recent changes in healthcare laws, you may see results of your pathology and/or laboratory studies on MyChart before the doctors have had a chance to review them. We understand that in some cases there may be results that are confusing  or concerning to you. Please understand that not all results are received at the same time and often the doctors may need to interpret multiple results in order to provide you with the best plan of care or course of treatment. Therefore, we ask that you please give us  2 business days to thoroughly review all your results before contacting the office for clarification. Should we see a critical lab result, you will be contacted sooner.   If You Need Anything After Your Visit  If you have any questions or concerns for your doctor, please call our main line at [phone] and press option 4 to reach your doctor's medical assistant. If no one answers, please leave a voicemail as directed and we will return your call as soon as possible. Messages left after 4 pm will be  answered the following business day.   You may also send us  a message via MyChart. We typically respond to MyChart messages within 1-2 business days.  For prescription refills, please ask your pharmacy to contact our office. Our fax number is [phone].  If you have an urgent issue when the clinic is closed that cannot wait until the next business day, you can page your doctor at the number below.    Please note that while we do our best to be available for urgent issues outside of office hours, we are not available 24/7.   If you have an urgent issue and are unable to reach us , you may choose to seek medical care at your doctor's office, retail clinic, urgent care center, or emergency room.  If you have a medical emergency, please immediately call 911 or go to the emergency department.  Pager Numbers  - Dr. Hester: [phone]  - Dr. Jackquline: [phone]  - Dr. Claudene: [phone]   - Dr. Raymund: [phone]  In the event of inclement weather, please call our main line at [phone] for an update on the status of any delays or closures.  Dermatology Medication Tips: Please keep the boxes that topical medications come in in order to help keep track of the instructions about where and how to use these. Pharmacies typically print the medication instructions only on the boxes and not directly on the medication tubes.   If your medication is too expensive, please contact our office at [phone] option 4 or send us  a message through MyChart.   We are unable to tell what your co-pay for medications will be in advance as this is different depending on your insurance coverage. However, we may be able to find a substitute medication at lower cost or fill out paperwork to get insurance to cover a needed medication.   If a prior authorization is required to get your medication covered by your insurance company, please allow us  1-2 business days to complete this process.  Drug  prices often vary depending on where the prescription is filled and some pharmacies may offer cheaper prices.  The website www.goodrx.com contains coupons for medications through different pharmacies. The prices here do not account for what the cost may be with help from insurance (it may be cheaper with your insurance), but the website can give you the price if you did not use any insurance.  - You can print the associated coupon and take it with your prescription to the pharmacy.  - You may also stop by our office during regular business hours and pick up a GoodRx coupon card.  - If you need your prescription sent electronically to  a different pharmacy, notify our office through [facility] or by phone at [phone] option 4.     Si Usted Necesita Algo Despus de Su Visita  Tambin puede enviarnos un mensaje a travs de Clinical Cytogeneticist. Por lo general respondemos a los mensajes de MyChart en el transcurso de 1 a 2 das hbiles.  Para renovar recetas, por favor pida a su farmacia que se ponga en contacto con nuestra oficina. Randi lakes de fax es [place] [phone].  Si tiene un asunto urgente cuando la clnica est cerrada y que no puede esperar hasta el siguiente da hbil, puede llamar/localizar a su doctor(a) al nmero que aparece a continuacin.   Por favor, tenga en cuenta que aunque hacemos todo lo posible para estar disponibles para asuntos urgentes fuera del horario de [place], no estamos disponibles las 24 horas del da, los 7 [address]  [address] de la [place].   Si tiene un problema urgente y no puede comunicarse con nosotros, puede optar por buscar atencin mdica  en el consultorio de su doctor(a), en una clnica privada, en un centro de atencin urgente o en una sala de emergencias.  Si tiene engineer, drilling, por favor llame inmediatamente al 911 o vaya a la sala de emergencias.  Nmeros de bper  - Dr. Hester: [phone]  - Dra. Jackquline: [phone]  - Dr. Claudene:  [phone]  - Dra. Kitts: [phone]  En caso de inclemencias del [place], por favor llame a nuestra lnea principal al [phone] para una actualizacin sobre el estado de cualquier retraso o cierre.  Consejos para la medicacin en dermatologa: Por favor, guarde las cajas en las que vienen los medicamentos de uso tpico para ayudarle a seguir las instrucciones sobre dnde y cmo usarlos. Las farmacias generalmente imprimen las instrucciones del medicamento slo en las cajas y no directamente en los tubos del [place].   Si su medicamento es muy caro, por favor, pngase en contacto con landry rieger llamando al [phone] y presione la opcin 4 o envenos un mensaje a travs de Clinical Cytogeneticist.   No podemos decirle cul ser su copago por los medicamentos por adelantado ya que esto es diferente dependiendo de la cobertura de su seguro. Sin embargo, es posible que podamos encontrar un medicamento sustituto a audiological scientist un formulario para que el seguro cubra el medicamento que se considera necesario.   Si se requiere una autorizacin previa para que su compaa de seguros cubra su medicamento, por favor permtanos de 1 a 2 das hbiles para completar este proceso.  Los precios de los medicamentos varan con frecuencia dependiendo del environmental consultant de dnde se surte la receta y alguna farmacias pueden ofrecer precios ms baratos.  El sitio web www.goodrx.com tiene cupones para medicamentos de health and safety inspector. Los precios aqu no tienen en cuenta lo que podra costar con la ayuda del seguro (puede ser ms barato con su seguro), pero el sitio web puede darle el precio si no utiliz tourist information centre manager.  - Puede imprimir el cupn correspondiente y llevarlo con su receta a la farmacia.  - Tambin puede pasar por nuestra oficina durante el horario de atencin regular y education officer, museum una tarjeta de cupones de GoodRx.  - Si necesita que su receta se enve electrnicamente a una farmacia diferente, informe  a nuestra oficina a travs de MyChart de [place] o por telfono llamando al [phone] y presione la opcin 4.

## 2024-02-22 NOTE — Progress Notes (Signed)
 Subjective   Latoya Mckinney is a 78 y.o. female who presents for the following: Total body skin exam for skin cancer screening and mole check. The patient has spots, moles and lesions to be evaluated, some may be new or changing and the patient may have concern these could be cancer.. Patient is established patient   Today patient reports: Patient has had MOHS on scalp on 02/06/24 with Dr. Paci.   Review of Systems:    No other skin or systemic complaints except as noted in HPI or Assessment and Plan.  The following portions of the chart were reviewed this encounter and updated as appropriate: medications, allergies, medical history  Relevant Medical History:  n/a   Objective  (SKPE) Well appearing patient in no apparent distress; mood and affect are within normal limits. Examination was performed of the: Full Skin Examination: scalp, head, eyes, ears, nose, lips, neck, chest, axillae, abdomen, back, buttocks, bilateral upper extremities, bilateral lower extremities, hands, feet, fingers, toes, fingernails, and toenails.   Examination notable for: SKIN EXAM, Angioma(s): Scattered red vascular papule(s)  , Lentigo/lentigines: Scattered pigmented macules that are tan to brown in color and are somewhat non-uniform in shape and concentrated in the sun-exposed areas, Nevus/nevi: Scattered well-demarcated, regular, pigmented macule(s) and/or papule(s)  , Seborrheic Keratosis(es): Stuck-on appearing keratotic papule(s) on the trunk, some  irritated with redness, crusting, edema, and/or partial avulsion, Actinic Damage/Elastosis: chronic sun damage: dyspigmentation, telangiectasia, and wrinkling, Actinic keratosis: Scaly erythematous macule(s) concentrated on sun exposed areas . - Well healed scar on scalp.  Examination limited by: Undergarments   Bilateral lower legs x8, back x1 (9) Erythematous thin papules/macules with gritty scale.  Assessment & Plan  (SKAP)   SKIN CANCER SCREENING  PERFORMED TODAY.  BENIGN SKIN FINDINGS  - Lentigines  - Seborrheic keratoses  - Hemangiomas   - Nevus/Multiple Benign Nevi - Reassurance provided regarding the benign appearance of lesions noted on exam today; no treatment is indicated in the absence of symptoms/changes. - Reinforced importance of photoprotective strategies including liberal and frequent sunscreen use of a broad-spectrum SPF 30 or greater, use of protective clothing, and sun avoidance for prevention of cutaneous malignancy and photoaging.  Counseled patient on the importance of regular self-skin monitoring as well as routine clinical skin examinations as scheduled.   ACTINIC DAMAGE - Chronic condition, secondary to cumulative UV/sun exposure - Recommend daily broad spectrum sunscreen SPF 30+ to sun-exposed areas, reapply every 2 hours as needed.  - Staying in the shade or wearing long sleeves, sun glasses (UVA+UVB protection) and wide brim hats (4-inch brim around the entire circumference of the hat) are also recommended for sun protection.  - Call for new or changing lesions.  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 - Patient advised to perform monthly skin exams for any new, changing, or concerning moles    Level of service outlined above   Patient instructions (SKPI)   Procedures, orders, diagnosis for this visit:  INFLAMED SEBORRHEIC KERATOSIS (2) R cheek/ mandible x2 (2) Destruction of lesion - R cheek/ mandible x2 (2) Complexity: simple   Destruction method: cryotherapy  Informed consent: discussed and consent obtained    Timeout:  patient name, date of birth, surgical site, and procedure verified Lesion destroyed using liquid nitrogen: Yes   Region frozen until ice ball extended beyond lesion: Yes   Cryo cycles: 1 or 2. Outcome: patient tolerated procedure well with no complications   Post-procedure details: wound care instructions given   Additional details:  Prior to procedure, discussed risks of blister formation, small wound, skin dyspigmentation, or rare scar following cryotherapy. Recommend Vaseline ointment to treated areas while healing.   AK (ACTINIC KERATOSIS) (9) Bilateral lower legs x8, back x1 (9) Actinic keratoses are precancerous spots that appear secondary to cumulative UV radiation exposure/sun exposure over time. They are chronic with expected duration over 1 year. A portion of actinic keratoses will progress to squamous cell carcinoma of the skin. It is not possible to reliably predict which spots will progress to skin cancer and so treatment is recommended to prevent development of skin cancer.  Recommend daily broad spectrum sunscreen SPF 30+ to sun-exposed areas, reapply every 2 hours as needed.  Recommend staying in the shade or wearing long sleeves, sun glasses (UVA+UVB protection) and wide brim hats (4-inch brim around the entire circumference of the hat). Call for new or changing lesions. Destruction of lesion - Bilateral lower legs x8, back x1 (9) Complexity: simple   Destruction method: cryotherapy   Informed consent: discussed and consent obtained   Timeout:  patient name, date of birth, surgical site, and procedure verified Lesion destroyed using liquid nitrogen: Yes   Region frozen until ice ball extended beyond lesion: Yes   Cryo cycles: 1 or 2. Outcome: patient tolerated procedure well with no complications   Post-procedure details: wound care instructions given   Additional details:  Prior to procedure, discussed risks of blister formation, small wound, skin  dyspigmentation, or rare scar following cryotherapy. Recommend Vaseline ointment to treated areas while healing.    Inflamed seborrheic keratosis -     Destruction of lesion  AK (actinic keratosis) -     Destruction of lesion    Return to clinic: Return in about 6 months (around 08/22/2024) for TBSE, w/ Dr. Raymund.  I, Almetta Nora, RMA, am acting as scribe for Lauraine JAYSON Raymund, MD .   Documentation: I have reviewed the above documentation for accuracy and completeness, and I agree with the above.  Lauraine JAYSON Raymund, MD

## 2024-02-27 ENCOUNTER — Encounter: Admitting: Dermatology

## 2024-02-28 ENCOUNTER — Encounter: Payer: Self-pay | Admitting: Dermatology

## 2024-03-06 ENCOUNTER — Encounter: Admitting: Dermatology

## 2024-04-01 ENCOUNTER — Ambulatory Visit: Admitting: Dermatology

## 2024-04-03 ENCOUNTER — Ambulatory Visit (INDEPENDENT_AMBULATORY_CARE_PROVIDER_SITE_OTHER): Admitting: Dermatology

## 2024-04-03 ENCOUNTER — Encounter: Payer: Self-pay | Admitting: Dermatology

## 2024-04-03 VITALS — BP 135/80 | HR 68

## 2024-04-03 DIAGNOSIS — Z85828 Personal history of other malignant neoplasm of skin: Secondary | ICD-10-CM | POA: Diagnosis not present

## 2024-04-03 DIAGNOSIS — C4499 Other specified malignant neoplasm of skin, unspecified: Secondary | ICD-10-CM

## 2024-04-03 DIAGNOSIS — L905 Scar conditions and fibrosis of skin: Secondary | ICD-10-CM | POA: Diagnosis not present

## 2024-04-03 DIAGNOSIS — T1490XD Injury, unspecified, subsequent encounter: Secondary | ICD-10-CM

## 2024-04-03 NOTE — Progress Notes (Signed)
" ° °  Follow Up Visit   Subjective  Latoya Mckinney is a 79 y.o. 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 The patient has been bandaging the wound as directed. The endorse the following concerns: none   The following portions of the chart were reviewed this encounter and updated as appropriate: medications, allergies, medical history  Review of Systems:  No other skin or systemic complaints except as noted in HPI or Assessment and Plan.  Objective  Well appearing patient in no apparent distress; mood and affect are within normal limits.  A focal examination was performed including scalp, head, face and mid parietal scalp. All findings within normal limits unless otherwise noted below.  Healing wound with mild erythema  Relevant physical exam findings are noted in the Assessment and Plan.    Assessment & Plan    Healing Wound s/p Mohs for SCC, treated on 02/19/24, repaired with linear closure - Reassured that wound is healing well - No evidence of infection - No swelling, induration, purulence, dehiscence, or tenderness out of proportion to the clinical exam, see photo above - Discussed that scars take up to 12 months to mature from the date of surgery - Recommend SPF 30+ to scar daily to prevent purple color from UV exposure during scar maturation process - Discussed that erythema and raised appearance of scar will fade over the next 4-6 months - OK to start scar massage at 4-6 weeks post-op - Can consider silicone based products for scar healing starting at 6 weeks post-op - Ok to continue ointment daily to wound under a bandage for another week     No follow-ups on file.  I, Darice Smock, CMA, am acting as scribe for RUFUS CHRISTELLA HOLY, MD.   Documentation: I have reviewed the above documentation for accuracy and  completeness, and I agree with the above.  RUFUS CHRISTELLA HOLY, MD  "

## 2024-04-03 NOTE — Progress Notes (Signed)
" ° °  Follow Up Visit   Subjective  Latoya Mckinney is a 79 y.o. 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 The patient has been bandaging the wound as directed. The endorse the following concerns: none   The following portions of the chart were reviewed this encounter and updated as appropriate: medications, allergies, medical history  Review of Systems:  No other skin or systemic complaints except as noted in HPI or Assessment and Plan.  Objective  Well appearing patient in no apparent distress; mood and affect are within normal limits.  A focal examination was performed including the scalp. All findings within normal limits unless otherwise noted below.  Healing wound with mild erythema  Relevant physical exam findings are noted in the Assessment and Plan.     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  - Reassured that wound is healing well - No evidence of infection - No swelling, induration, purulence, dehiscence, or tenderness out of proportion to the clinical exam, see photo above - Discussed that scars take up to 12 months to mature from the date of surgery - Ok to continue ointment daily to wound under a bandage for another week   HISTORY OF BASOSQUAMOUS/BASAL CELL CARCINOMA - No evidence of recurrence today - Recommend regular full body skin exams - Recommend daily broad spectrum sunscreen SPF 30+ to sun-exposed areas, reapply every 2 hours as needed.    Return in about 1 month (around 05/04/2024) for Wound Check.  LILLETTE Rollene Gobble, RN, am acting as scribe for RUFUS CHRISTELLA HOLY, MD .   Documentation: I have reviewed the  above documentation for accuracy and completeness, and I agree with the above.  RUFUS CHRISTELLA HOLY, MD "

## 2024-04-03 NOTE — Patient Instructions (Signed)

## 2024-05-09 ENCOUNTER — Ambulatory Visit: Admitting: Dermatology

## 2024-09-04 ENCOUNTER — Ambulatory Visit
